# Patient Record
Sex: Male | Born: 1980 | Race: White | Hispanic: No | Marital: Single | Smoking: Never smoker
Health system: Southern US, Community
[De-identification: ages and names within clinical notes are randomized; demographics above are authoritative.]

## PROBLEM LIST (undated history)

## (undated) DIAGNOSIS — N200 Calculus of kidney: Secondary | ICD-10-CM

---
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
## 2013-12-13 ENCOUNTER — Encounter (HOSPITAL_COMMUNITY): Payer: Self-pay | Admitting: Emergency Medicine

## 2013-12-13 ENCOUNTER — Emergency Department (HOSPITAL_COMMUNITY): Payer: BC Managed Care – PPO

## 2013-12-13 ENCOUNTER — Emergency Department (HOSPITAL_COMMUNITY)
Admission: EM | Admit: 2013-12-13 | Discharge: 2013-12-13 | Disposition: A | Discharge status: Home / Self Care | Payer: BC Managed Care – PPO | Attending: Emergency Medicine | Admitting: Emergency Medicine

## 2013-12-13 DIAGNOSIS — N23 Unspecified renal colic: Secondary | ICD-10-CM

## 2013-12-13 DIAGNOSIS — Z79899 Other long term (current) drug therapy: Secondary | ICD-10-CM | POA: Insufficient documentation

## 2013-12-13 LAB — URINALYSIS, ROUTINE W REFLEX MICROSCOPIC
Bilirubin Urine: NEGATIVE
Glucose, UA: NEGATIVE mg/dL
Ketones, ur: NEGATIVE mg/dL
Leukocytes, UA: NEGATIVE
Nitrite: NEGATIVE
Protein, ur: NEGATIVE mg/dL
Specific Gravity, Urine: 1.026 (ref 1.005–1.030)
Urobilinogen, UA: 1 mg/dL (ref 0.0–1.0)
pH: 7.5 (ref 5.0–8.0)

## 2013-12-13 LAB — URINE MICROSCOPIC-ADD ON

## 2013-12-13 LAB — I-STAT CHEM 8, ED
BUN: 16 mg/dL (ref 6–23)
Calcium, Ion: 1.22 mmol/L (ref 1.12–1.23)
Chloride: 101 mEq/L (ref 96–112)
Creatinine, Ser: 1.1 mg/dL (ref 0.50–1.35)
Glucose, Bld: 89 mg/dL (ref 70–99)
HCT: 47 % (ref 39.0–52.0)
Hemoglobin: 16 g/dL (ref 13.0–17.0)
Potassium: 4.3 mEq/L (ref 3.7–5.3)
Sodium: 142 mEq/L (ref 137–147)
TCO2: 31 mmol/L (ref 0–100)

## 2013-12-13 MED ORDER — DESLORATADINE-PSEUDOEPHED ER 2.5-120 MG PO TB12: 1.0000 | ORAL_TABLET | Freq: Two times a day (BID) | ORAL | Status: DC

## 2013-12-13 MED ORDER — HYDROMORPHONE HCL PF 1 MG/ML IJ SOLN
1.0000 mg | Freq: Once | INTRAMUSCULAR | Status: AC
  Administered 2013-12-13: 1 mg via INTRAMUSCULAR
  Filled 2013-12-13: qty 1

## 2013-12-13 MED ORDER — ONDANSETRON 4 MG PO TBDP
4.0000 mg | ORAL_TABLET | Freq: Once | ORAL | Status: AC
  Administered 2013-12-13: 4 mg via ORAL
  Filled 2013-12-13: qty 1

## 2013-12-13 MED ORDER — OXYCODONE-ACETAMINOPHEN 5-325 MG PO TABS: 1.0000 | ORAL_TABLET | ORAL | Status: AC | PRN

## 2013-12-13 MED ORDER — IBUPROFEN 600 MG PO TABS: 600.0000 mg | ORAL_TABLET | Freq: Four times a day (QID) | ORAL | Status: DC | PRN

## 2013-12-13 MED ORDER — KETOROLAC TROMETHAMINE 30 MG/ML IJ SOLN
30.0000 mg | Freq: Once | INTRAMUSCULAR | Status: AC
  Administered 2013-12-13: 30 mg via INTRAMUSCULAR
  Filled 2013-12-13: qty 1

## 2013-12-13 NOTE — ED Provider Notes (Signed)
CSN: [id number]     Arrival date & time 12/13/13  1338 History   First MD Initiated Contact with Patient 12/13/13 1354     Chief Complaint  Patient presents with  . Flank Pain  . Groin Pain     (Consider location/radiation/quality/duration/timing/severity/associated sxs/prior Treatment) HPI  33 year old male with left flank pain with radiation to his groin. Onset approximately 20-30 minutes prior to arrival. Acute onset while at a restaurant eating. Pain. Sharp and severe. Got up to walk around without any relief. He then went to the bathroom and had to lay on the floor because the pain was so severe. He is subsequently found by his friend after she cannot return to the table and he was brought to the ER. Pain is currently better, but states that it is still pretty bad. No history similar complaints. No urinary complaints. No fevers or chills. Nausea, but no vomiting. No history kidney stones. History of seasonal allergies, otherwise healthy. No history of any abdominal surgeries.  History reviewed. No pertinent past medical history. History reviewed. No pertinent past surgical history. No family history on file. History  Substance Use Topics  . Smoking status: Never Smoker   . Smokeless tobacco: Not on file  . Alcohol Use: No    Review of Systems  All systems reviewed and negative, other than as noted in HPI.    Allergies  Apple and Codeine  Home Medications   Current Outpatient Rx  Name  Route  Sig  Dispense  Refill  . cetirizine (ZYRTEC) 10 MG tablet   Oral   Take 10 mg by mouth daily.          BP 146/88  Pulse 85  Temp(Src) 98.5 F (36.9 C) (Oral)  Resp 20  SpO2 96% Physical Exam  Nursing note and vitals reviewed. Constitutional: He appears well-developed and well-nourished. No distress.  HENT:  Head: Normocephalic and atraumatic.  Eyes: Conjunctivae are normal. Right eye exhibits no discharge. Left eye exhibits no discharge.  Neck: Neck supple.   Cardiovascular: Normal rate, regular rhythm and normal heart sounds.  Exam reveals no gallop and no friction rub.   No murmur heard. Pulmonary/Chest: Effort normal and breath sounds normal. No respiratory distress.  Abdominal: Soft. He exhibits no distension. There is no tenderness.  Genitourinary:  l cva tenderness  Musculoskeletal: He exhibits no edema and no tenderness.  Neurological: He is alert.  Skin: Skin is warm and dry.  Psychiatric: He has a normal mood and affect. His behavior is normal. Thought content normal.    ED Course  Procedures (including critical care time) Labs Review Labs Reviewed  URINALYSIS, ROUTINE W REFLEX MICROSCOPIC - Abnormal; Notable for the following:    APPearance CLOUDY (*)    Hgb urine dipstick LARGE (*)    All other components within normal limits  URINE MICROSCOPIC-ADD ON - Abnormal; Notable for the following:    Casts HYALINE CASTS (*)    All other components within normal limits  I-STAT CHEM 8, ED   Imaging Review No results found.  Ct Abdomen Pelvis Wo Contrast  12/13/2013   CLINICAL DATA:  FLANK PAIN GROIN PAIN  EXAM: CT ABDOMEN AND PELVIS WITHOUT CONTRAST  TECHNIQUE: Multidetector CT imaging of the abdomen and pelvis was performed following the standard protocol without intravenous contrast.  COMPARISON:  None.  FINDINGS: The lung bases are clear.  There is a 2 mm proximal left ureteral calculus without obstructive uropathy. No perinephric stranding is seen. There is a  5.6 cm hypodense, fluid attenuating exophytic left anterior interpolar mass most consistent with a cyst. The bladder is unremarkable.  The liver demonstrates no focal abnormality. The gallbladder is unremarkable. The spleen demonstrates no focal abnormality. The adrenal glands and pancreas are normal.  The unopacified stomach, duodenum, small intestine and large intestine are unremarkable, but evaluation is limited by lack of oral contrast. There is a normal caliber appendix in  the right lower quadrant without periappendiceal inflammatory changes. There is no pneumoperitoneum, pneumatosis, or portal venous gas. There is no abdominal or pelvic free fluid. There is no lymphadenopathy.  The abdominal aorta is normal in caliber.  The osseous structures are unremarkable.  IMPRESSION: 1. There is a 2 mm proximal left ureteral calculus without obstructive uropathy.   Electronically Signed   By: Elige Ko   On: 12/13/2013 15:51     EKG Interpretation None      MDM   Final diagnoses:  Ureteral colic    33 year old male with symptoms consistent with ureteral colic. Afebrile and hemodynamically stable. No known past history of stones. Will image. Symptomatic treatment.  CT subsequently confirming clinical suspicions. Pain is currently controlled. Plan continued symptomatic treatment at this time and expectant management. Return precautions were discussed. Outpatient uurology followup as needed otherwise.   Raeford Razor, MD 12/15/13 [phone]

## 2013-12-13 NOTE — Discharge Instructions (Signed)
Kidney Stones  Kidney stones (urolithiasis) are deposits that form inside your kidneys. The intense pain is caused by the stone moving through the urinary tract. When the stone moves, the ureter goes into spasm around the stone. The stone is usually passed in the urine.   CAUSES   · A disorder that makes certain neck glands produce too much parathyroid hormone (primary hyperparathyroidism).  · A buildup of uric acid crystals, similar to gout in your joints.  · Narrowing (stricture) of the ureter.  · A kidney obstruction present at birth (congenital obstruction).  · Previous surgery on the kidney or ureters.  · Numerous kidney infections.  SYMPTOMS   · Feeling sick to your stomach (nauseous).  · Throwing up (vomiting).  · Blood in the urine (hematuria).  · Pain that usually spreads (radiates) to the groin.  · Frequency or urgency of urination.  DIAGNOSIS   · Taking a history and physical exam.  · Blood or urine tests.  · CT scan.  · Occasionally, an examination of the inside of the urinary bladder (cystoscopy) is performed.  TREATMENT   · Observation.  · Increasing your fluid intake.  · Extracorporeal shock wave lithotripsy This is a noninvasive procedure that uses shock waves to break up kidney stones.  · Surgery may be needed if you have severe pain or persistent obstruction. There are various surgical procedures. Most of the procedures are performed with the use of small instruments. Only small incisions are needed to accommodate these instruments, so recovery time is minimized.  The size, location, and chemical composition are all important variables that will determine the proper choice of action for you. Talk to your health care provider to better understand your situation so that you will minimize the risk of injury to yourself and your kidney.   HOME CARE INSTRUCTIONS   · Drink enough water and fluids to keep your urine clear or pale yellow. This will help you to pass the stone or stone fragments.  · Strain  all urine through the provided strainer. Keep all particulate matter and stones for your health care provider to see. The stone causing the pain may be as small as a grain of salt. It is very important to use the strainer each and every time you pass your urine. The collection of your stone will allow your health care provider to analyze it and verify that a stone has actually passed. The stone analysis will often identify what you can do to reduce the incidence of recurrences.  · Only take over-the-counter or prescription medicines for pain, discomfort, or fever as directed by your health care provider.  · Make a follow-up appointment with your health care provider as directed.  · Get follow-up X-rays if required. The absence of pain does not always mean that the stone has passed. It may have only stopped moving. If the urine remains completely obstructed, it can cause loss of kidney function or even complete destruction of the kidney. It is your responsibility to make sure X-rays and follow-ups are completed. Ultrasounds of the kidney can show blockages and the status of the kidney. Ultrasounds are not associated with any radiation and can be performed easily in a matter of minutes.  SEEK MEDICAL CARE IF:  · You experience pain that is progressive and unresponsive to any pain medicine you have been prescribed.  SEEK IMMEDIATE MEDICAL CARE IF:   · Pain cannot be controlled with the prescribed medicine.  · You have a fever   or shaking chills.  · The severity or intensity of pain increases over 18 hours and is not relieved by pain medicine.  · You develop a new onset of abdominal pain.  · You feel faint or pass out.  · You are unable to urinate.  MAKE SURE YOU:   · Understand these instructions.  · Will watch your condition.  · Will get help right away if you are not doing well or get worse.  Document Released: 09/05/2005 Document Revised: 05/08/2013 Document Reviewed: 02/06/2013  [employer]® Patient Information ©2014  [employer].

## 2013-12-13 NOTE — ED Notes (Signed)
Pt from home reports that he had sudden onset L flank pain 20 mins PTA that radiates to groin area. Pt denies injury or activity that may have caused pain. Pt denies hx of kidney stones, dysuria, hematuria. Pt denies CP/SOB. Pt is A&O and in NAD

## 2013-12-25 ENCOUNTER — Encounter (HOSPITAL_COMMUNITY): Payer: Self-pay | Admitting: Emergency Medicine

## 2013-12-25 ENCOUNTER — Emergency Department (HOSPITAL_COMMUNITY)
Admission: EM | Admit: 2013-12-25 | Discharge: 2013-12-25 | Disposition: A | Discharge status: Home / Self Care | Payer: BC Managed Care – PPO | Attending: Emergency Medicine | Admitting: Emergency Medicine

## 2013-12-25 ENCOUNTER — Emergency Department (HOSPITAL_COMMUNITY): Payer: BC Managed Care – PPO

## 2013-12-25 DIAGNOSIS — N200 Calculus of kidney: Secondary | ICD-10-CM | POA: Insufficient documentation

## 2013-12-25 DIAGNOSIS — IMO0002 Reserved for concepts with insufficient information to code with codable children: Secondary | ICD-10-CM | POA: Insufficient documentation

## 2013-12-25 DIAGNOSIS — Z79899 Other long term (current) drug therapy: Secondary | ICD-10-CM | POA: Insufficient documentation

## 2013-12-25 HISTORY — DX: Calculus of kidney: N20.0

## 2013-12-25 LAB — URINALYSIS, ROUTINE W REFLEX MICROSCOPIC
GLUCOSE, UA: NEGATIVE mg/dL
Ketones, ur: 40 mg/dL — AB
Leukocytes, UA: NEGATIVE
Nitrite: NEGATIVE
PH: 5.5 (ref 5.0–8.0)
Protein, ur: NEGATIVE mg/dL
SPECIFIC GRAVITY, URINE: 1.034 — AB (ref 1.005–1.030)
Urobilinogen, UA: 1 mg/dL (ref 0.0–1.0)

## 2013-12-25 LAB — URINE MICROSCOPIC-ADD ON

## 2013-12-25 MED ORDER — OXYCODONE-ACETAMINOPHEN 5-325 MG PO TABS: 1.0000 | ORAL_TABLET | Freq: Four times a day (QID) | ORAL | Status: AC | PRN

## 2013-12-25 NOTE — Discharge Instructions (Signed)
Kidney Stones  Kidney stones (urolithiasis) are deposits that form inside your kidneys. The intense pain is caused by the stone moving through the urinary tract. When the stone moves, the ureter goes into spasm around the stone. The stone is usually passed in the urine.   CAUSES   · A disorder that makes certain neck glands produce too much parathyroid hormone (primary hyperparathyroidism).  · A buildup of uric acid crystals, similar to gout in your joints.  · Narrowing (stricture) of the ureter.  · A kidney obstruction present at birth (congenital obstruction).  · Previous surgery on the kidney or ureters.  · Numerous kidney infections.  SYMPTOMS   · Feeling sick to your stomach (nauseous).  · Throwing up (vomiting).  · Blood in the urine (hematuria).  · Pain that usually spreads (radiates) to the groin.  · Frequency or urgency of urination.  DIAGNOSIS   · Taking a history and physical exam.  · Blood or urine tests.  · CT scan.  · Occasionally, an examination of the inside of the urinary bladder (cystoscopy) is performed.  TREATMENT   · Observation.  · Increasing your fluid intake.  · Extracorporeal shock wave lithotripsy This is a noninvasive procedure that uses shock waves to break up kidney stones.  · Surgery may be needed if you have severe pain or persistent obstruction. There are various surgical procedures. Most of the procedures are performed with the use of small instruments. Only small incisions are needed to accommodate these instruments, so recovery time is minimized.  The size, location, and chemical composition are all important variables that will determine the proper choice of action for you. Talk to your health care provider to better understand your situation so that you will minimize the risk of injury to yourself and your kidney.   HOME CARE INSTRUCTIONS   · Drink enough water and fluids to keep your urine clear or pale yellow. This will help you to pass the stone or stone fragments.  · Strain  all urine through the provided strainer. Keep all particulate matter and stones for your health care provider to see. The stone causing the pain may be as small as a grain of salt. It is very important to use the strainer each and every time you pass your urine. The collection of your stone will allow your health care provider to analyze it and verify that a stone has actually passed. The stone analysis will often identify what you can do to reduce the incidence of recurrences.  · Only take over-the-counter or prescription medicines for pain, discomfort, or fever as directed by your health care provider.  · Make a follow-up appointment with your health care provider as directed.  · Get follow-up X-rays if required. The absence of pain does not always mean that the stone has passed. It may have only stopped moving. If the urine remains completely obstructed, it can cause loss of kidney function or even complete destruction of the kidney. It is your responsibility to make sure X-rays and follow-ups are completed. Ultrasounds of the kidney can show blockages and the status of the kidney. Ultrasounds are not associated with any radiation and can be performed easily in a matter of minutes.  SEEK MEDICAL CARE IF:  · You experience pain that is progressive and unresponsive to any pain medicine you have been prescribed.  SEEK IMMEDIATE MEDICAL CARE IF:   · Pain cannot be controlled with the prescribed medicine.  · You have a fever   or shaking chills.  · The severity or intensity of pain increases over 18 hours and is not relieved by pain medicine.  · You develop a new onset of abdominal pain.  · You feel faint or pass out.  · You are unable to urinate.  MAKE SURE YOU:   · Understand these instructions.  · Will watch your condition.  · Will get help right away if you are not doing well or get worse.  Document Released: 09/05/2005 Document Revised: 05/08/2013 Document Reviewed: 02/06/2013  [employer]® Patient Information ©2014  [employer].

## 2013-12-25 NOTE — ED Notes (Signed)
Pt states that he was here 2 wks ago for lt flank pain and was dx w/ kidney stone.  States that he thought he passed it.  Pain stopped for a week but Saturday night, pain came back.

## 2013-12-25 NOTE — ED Provider Notes (Signed)
CSN: [id number]     Arrival date & time 12/25/13  1358 History   First MD Initiated Contact with Patient 12/25/13 1502     Chief Complaint  Patient presents with  . Flank Pain     (Consider location/radiation/quality/duration/timing/severity/associated sxs/prior Treatment) Patient is a 33 y.o. male presenting with flank pain. The history is provided by the patient.  Flank Pain This is a recurrent problem. Pertinent negatives include no chest pain, no abdominal pain, no headaches and no shortness of breath.   patient was diagnosed with a ureteral stone on the left around 12 days ago. He states he cut it passed because the pain improved, however 5 days ago he began to have pain again. He states the pain did recur down his left groin. He states now has moved up to the left flank more. No dysuria. No fevers. He states the pain is somewhat controlled by oxycodone. He is hoping to be able go back to work. No nausea or vomiting.  Past Medical History  Diagnosis Date  . Kidney stone    History reviewed. No pertinent past surgical history. History reviewed. No pertinent family history. History  Substance Use Topics  . Smoking status: Never Smoker   . Smokeless tobacco: Not on file  . Alcohol Use: No    Review of Systems  Constitutional: Negative for activity change and appetite change.  Eyes: Negative for pain.  Respiratory: Negative for chest tightness and shortness of breath.   Cardiovascular: Negative for chest pain and leg swelling.  Gastrointestinal: Negative for nausea, vomiting, abdominal pain and diarrhea.  Genitourinary: Positive for frequency and flank pain. Negative for penile swelling and testicular pain.  Musculoskeletal: Negative for back pain and neck stiffness.  Skin: Negative for rash.  Neurological: Negative for weakness, numbness and headaches.  Psychiatric/Behavioral: Negative for behavioral problems.      Allergies  Apple and Codeine  Home Medications    Current Outpatient Rx  Name  Route  Sig  Dispense  Refill  . cetirizine (ZYRTEC) 10 MG tablet   Oral   Take 10 mg by mouth daily.         . fluticasone (FLONASE) 50 MCG/ACT nasal spray   Each Nare   Place 1 spray into both nostrils daily.         Marland Kitchen ibuprofen (ADVIL,MOTRIN) 600 MG tablet   Oral   Take 600 mg by mouth every 6 (six) hours as needed for mild pain.         Marland Kitchen oxyCODONE-acetaminophen (PERCOCET/ROXICET) 5-325 MG per tablet   Oral   Take 1-2 tablets by mouth every 4 (four) hours as needed for severe pain.   20 tablet   0   . oxyCODONE-acetaminophen (PERCOCET/ROXICET) 5-325 MG per tablet   Oral   Take 1-2 tablets by mouth every 6 (six) hours as needed for severe pain.   10 tablet   0    BP 128/75  Pulse 90  Temp(Src) 98.1 F (36.7 C) (Oral)  Resp 14  SpO2 95% Physical Exam  Nursing note and vitals reviewed. Constitutional: He is oriented to person, place, and time. He appears well-developed and well-nourished.  HENT:  Head: Normocephalic.  Cardiovascular: Normal rate, regular rhythm and normal heart sounds.   No murmur heard. Pulmonary/Chest: Effort normal and breath sounds normal.  Abdominal: Soft. Bowel sounds are normal. He exhibits no distension and no mass. There is no tenderness. There is no rebound and no guarding.  Genitourinary:  No CVA tenderness  Musculoskeletal: Normal range of motion. He exhibits no edema.  Neurological: He is alert and oriented to person, place, and time. No cranial nerve deficit.  Skin: Skin is warm and dry.  Psychiatric: He has a normal mood and affect.    ED Course  Procedures (including critical care time) Labs Review Labs Reviewed  URINALYSIS, ROUTINE W REFLEX MICROSCOPIC - Abnormal; Notable for the following:    Color, Urine AMBER (*)    Specific Gravity, Urine 1.034 (*)    Hgb urine dipstick MODERATE (*)    Bilirubin Urine SMALL (*)    Ketones, ur 40 (*)    All other components within normal limits   URINE MICROSCOPIC-ADD ON   Imaging Review Dg Abd 1 View  12/25/2013   CLINICAL DATA:  Flank pain, ureteral stone. History of kidney stones.  EXAM: ABDOMEN - 1 VIEW  COMPARISON:  CT ABD/PELV WO CM dated 12/13/2013  FINDINGS: A 4 mm calcification projects over the dependent portion of the bladder. No definite radiopaque calculi project over the renal outlines or expected course of the ureters bilaterally. Bowel gas pattern is unremarkable.  IMPRESSION: 4 mm calcification appears to be within the bladder.   Electronically Signed   By: Leanna BattlesMelinda  Blietz M.D.   On: 12/25/2013 15:32     EKG Interpretation None      MDM   Final diagnoses:  Kidney stone    Patient with flank pain. Recent kidney stone. KUB done and showed some likely in bladder. Patient states his been still having some pain. He only has to oxycodone for home. We'll get a prescription for a few more may just be bladder or ureter spasm from the stone. Patient was given a urine strainer, and will followup with urology as needed.   Juliet RudeNathan R. Rubin PayorPickering, MD 12/25/13 [phone]

## 2014-08-26 IMAGING — CR DG ABDOMEN 1V
1 series · 1 of 1 positions shown · non-contrast
Comparison: CT ABD/PELV WO CM dated 12/13/2013

CLINICAL DATA: Flank pain, ureteral stone. History of kidney
stones.

EXAM:
ABDOMEN - 1 VIEW

[t abdomen supine]
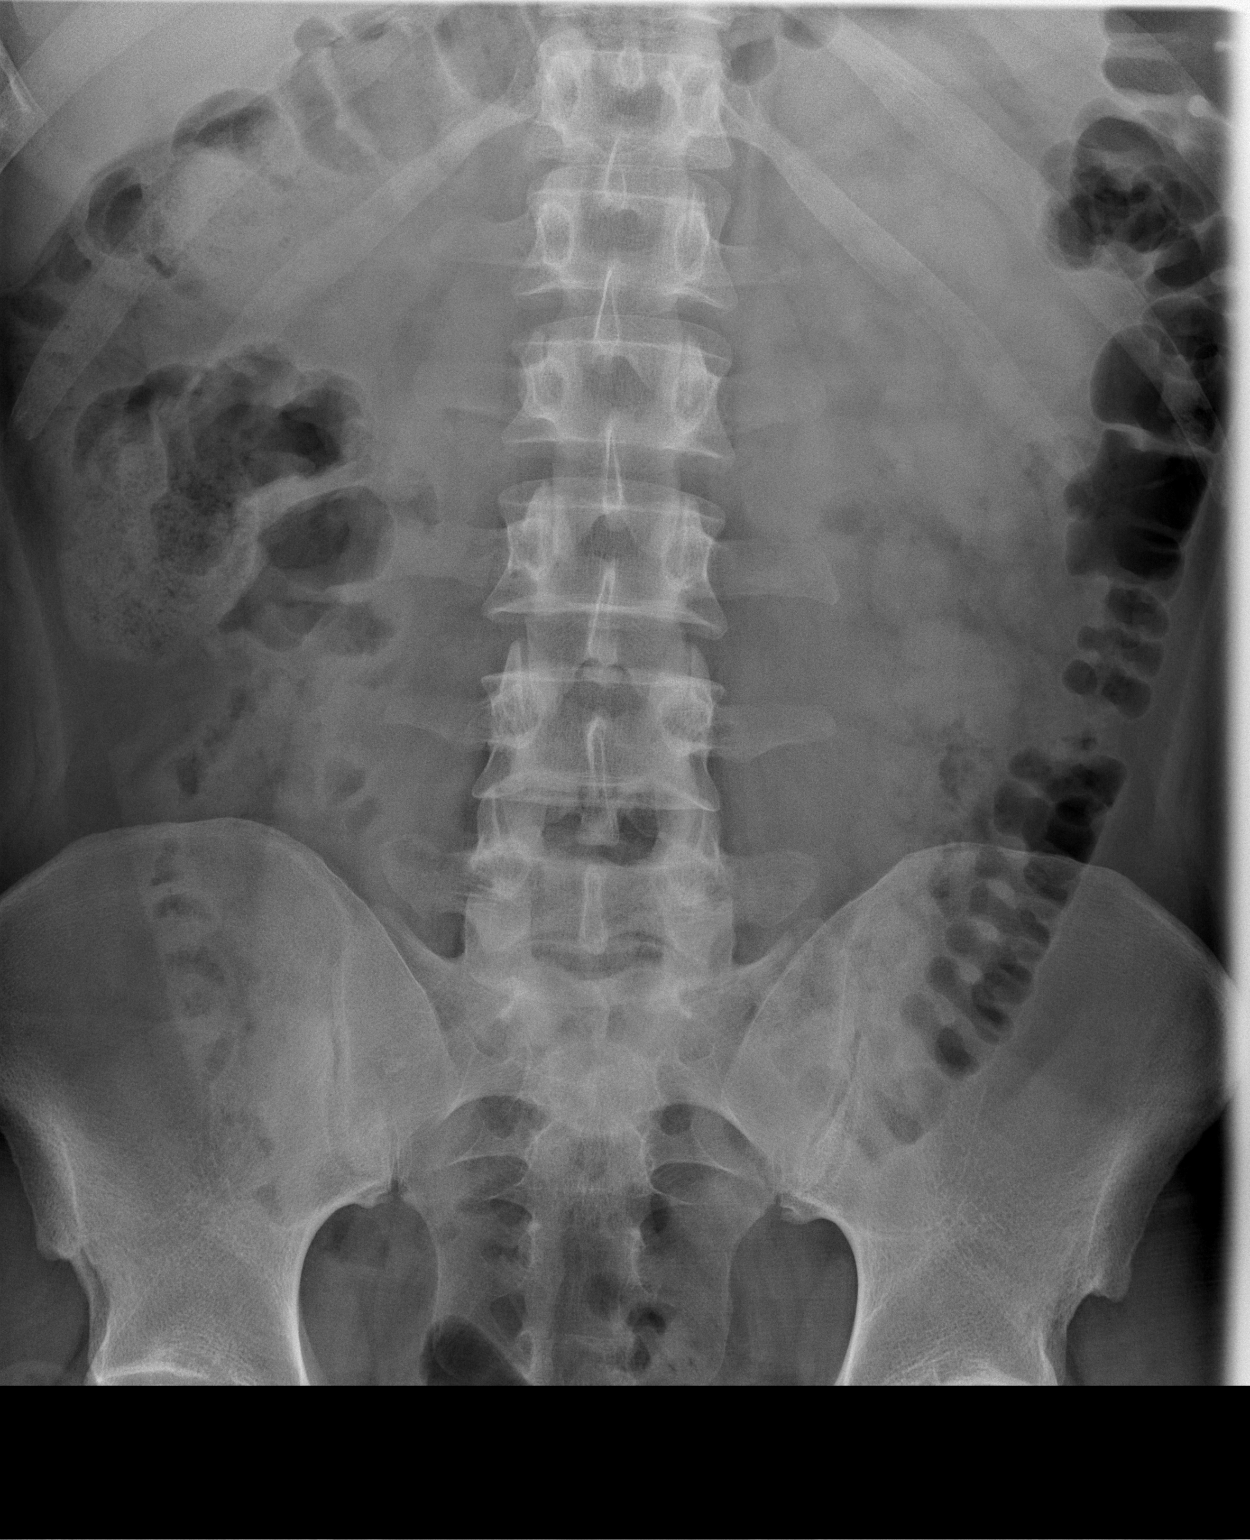

[1 of 1 positions shown; findings below may reference images not displayed]

FINDINGS: A 4 mm calcification projects over the dependent portion of the
bladder. No definite radiopaque calculi project over the renal
outlines or expected course of the ureters bilaterally. Bowel gas
pattern is unremarkable.
IMPRESSION: 4 mm calcification appears to be within the bladder.
